# Patient Record
Sex: Male | Born: 1970 | Hispanic: No | Marital: Married | Smoking: Former smoker
Health system: Southern US, Community
[De-identification: ages and names within clinical notes are randomized; demographics above are authoritative.]

## PROBLEM LIST (undated history)

## (undated) DIAGNOSIS — I10 Essential (primary) hypertension: Secondary | ICD-10-CM

## (undated) HISTORY — DX: Essential (primary) hypertension: I10

---

## 2011-02-08 ENCOUNTER — Ambulatory Visit: Payer: Self-pay

## 2011-02-08 DIAGNOSIS — R05 Cough: Secondary | ICD-10-CM

## 2012-09-10 ENCOUNTER — Emergency Department (HOSPITAL_COMMUNITY): Payer: Self-pay

## 2012-09-10 ENCOUNTER — Encounter (HOSPITAL_COMMUNITY): Payer: Self-pay | Admitting: *Deleted

## 2012-09-10 ENCOUNTER — Emergency Department (HOSPITAL_COMMUNITY)
Admission: EM | Admit: 2012-09-10 | Discharge: 2012-09-10 | Disposition: A | Discharge status: Home / Self Care | Payer: Self-pay | Attending: Emergency Medicine | Admitting: Emergency Medicine

## 2012-09-10 DIAGNOSIS — R1013 Epigastric pain: Secondary | ICD-10-CM | POA: Insufficient documentation

## 2012-09-10 DIAGNOSIS — I10 Essential (primary) hypertension: Secondary | ICD-10-CM | POA: Insufficient documentation

## 2012-09-10 DIAGNOSIS — Z87891 Personal history of nicotine dependence: Secondary | ICD-10-CM | POA: Insufficient documentation

## 2012-09-10 DIAGNOSIS — R0789 Other chest pain: Secondary | ICD-10-CM | POA: Insufficient documentation

## 2012-09-10 LAB — COMPREHENSIVE METABOLIC PANEL
Alkaline Phosphatase: 58 U/L (ref 39–117)
BUN: 16 mg/dL (ref 6–23)
GFR calc Af Amer: >90 mL/min (ref >90)
GFR calc non Af Amer: >90 mL/min (ref >90)
Glucose, Bld: 105 mg/dL — ABNORMAL HIGH (ref 70–99)
Potassium: 3.6 mEq/L (ref 3.5–5.1)
Total Bilirubin: 0.4 mg/dL (ref 0.3–1.2)
Total Protein: 6.8 g/dL (ref 6.0–8.3)

## 2012-09-10 LAB — CBC WITH DIFFERENTIAL/PLATELET
Eosinophils Absolute: 0.2 10*3/uL (ref 0.0–0.7)
Hemoglobin: 15.8 g/dL (ref 13.0–17.0)
Lymphs Abs: 1.8 10*3/uL (ref 0.7–4.0)
MCH: 31.6 pg (ref 26.0–34.0)
MCV: 86.8 fL (ref 78.0–100.0)
Monocytes Relative: 6 % (ref 3–12)
Neutrophils Relative %: 69 % (ref 43–77)
RBC: 5 MIL/uL (ref 4.22–5.81)

## 2012-09-10 LAB — LIPASE, BLOOD: Lipase: 49 U/L (ref 11–59)

## 2012-09-10 LAB — POCT I-STAT TROPONIN I: Troponin i, poc: 0.01 ng/mL (ref 0.00–0.08)

## 2012-09-10 MED ORDER — SODIUM CHLORIDE 0.9 % IV SOLN
INTRAVENOUS | Status: DC
  Administered 2012-09-10: 14:00:00 via INTRAVENOUS

## 2012-09-10 NOTE — ED Notes (Addendum)
Pt reports chest pressure radiating into his (L) arm intermittently x 6 months.  Reports dizziness, denies SOB, N/V.  Pt ambulatory.

## 2012-09-10 NOTE — ED Provider Notes (Addendum)
CSN: [id number]     Arrival date & time 09/10/12  1306 History     First MD Initiated Contact with Patient 09/10/12 1323     Chief Complaint  Patient presents with  . Chest Pain   (Consider location/radiation/quality/duration/timing/severity/associated sxs/prior Treatment) Patient is a 42 y.o. male presenting with chest pain. The history is provided by the patient and the spouse.  Chest Pain  patient here after having a 5 minute episode of epigastric discomfort described as a cramp without associated diaphoresis, dyspnea, nausea. Symptoms occurred at rest and were not associated with cough or fever. No radiation to his back. No treatment used for this prior to arrival and symptoms resolved spontaneously. Went to a drug store and his blood pressure checked and it was elevated with a systolic of 140. Patient is currently asymptomatic the  History reviewed. No pertinent past medical history. History reviewed. No pertinent past surgical history. History reviewed. No pertinent family history. History  Substance Use Topics  . Smoking status: Former Games developer  . Smokeless tobacco: Not on file  . Alcohol Use: Yes     Comment: socially-1-2 times a week    Review of Systems  Cardiovascular: Positive for chest pain.  All other systems reviewed and are negative.    Allergies  Review of patient's allergies indicates no known allergies.  Home Medications  No current outpatient prescriptions on file. BP 150/99  Pulse 69  Temp(Src) 97.7 F (36.5 C) (Oral)  Resp 18  SpO2 97% Physical Exam  Nursing note and vitals reviewed. Constitutional: He is oriented to person, place, and time. He appears well-developed and well-nourished.  Non-toxic appearance. No distress.  HENT:  Head: Normocephalic and atraumatic.  Eyes: Conjunctivae, EOM and lids are normal. Pupils are equal, round, and reactive to light.  Neck: Normal range of motion. Neck supple. No tracheal deviation present. No mass present.   Cardiovascular: Normal rate, regular rhythm and normal heart sounds.  Exam reveals no gallop.   No murmur heard. Pulmonary/Chest: Effort normal and breath sounds normal. No stridor. No respiratory distress. He has no decreased breath sounds. He has no wheezes. He has no rhonchi. He has no rales.  Abdominal: Soft. Normal appearance and bowel sounds are normal. He exhibits no distension. There is no tenderness. There is no rebound and no CVA tenderness.  Musculoskeletal: Normal range of motion. He exhibits no edema and no tenderness.  Neurological: He is alert and oriented to person, place, and time. He has normal strength. No cranial nerve deficit or sensory deficit. GCS eye subscore is 4. GCS verbal subscore is 5. GCS motor subscore is 6.  Skin: Skin is warm and dry. No abrasion and no rash noted.  Psychiatric: He has a normal mood and affect. His speech is normal and behavior is normal.    ED Course   Procedures (including critical care time)  Labs Reviewed - No data to display No results found. No diagnosis found.  MDM     Rate: 77   Rhythm: normal sinus rhythm  QRS Axis: normal  Intervals: normal  ST/T Wave abnormalities: normal  Conduction Disutrbances:none  Narrative Interpretation:   Old EKG Reviewed: none available  6:10 PM Patient negative delta troponin here. Symptoms are very atypical doubt that patient has ACS. Will get cardiac referral  Blood pressure was mildly elevated. Patient notes increased stress. Instructed patient to followup with his primary care Dr. for a repeat blood pressure  Toy Baker, MD 09/10/12 1811  Claude Manges  Freida Busman, MD 09/10/12 [phone]

## 2014-06-18 IMAGING — CR DG CHEST 2V
2 series · 2 of 2 positions shown · non-contrast
Comparison: None.

CLINICAL DATA: Chest pain

CHEST - 2 VIEW

[w chest pa]
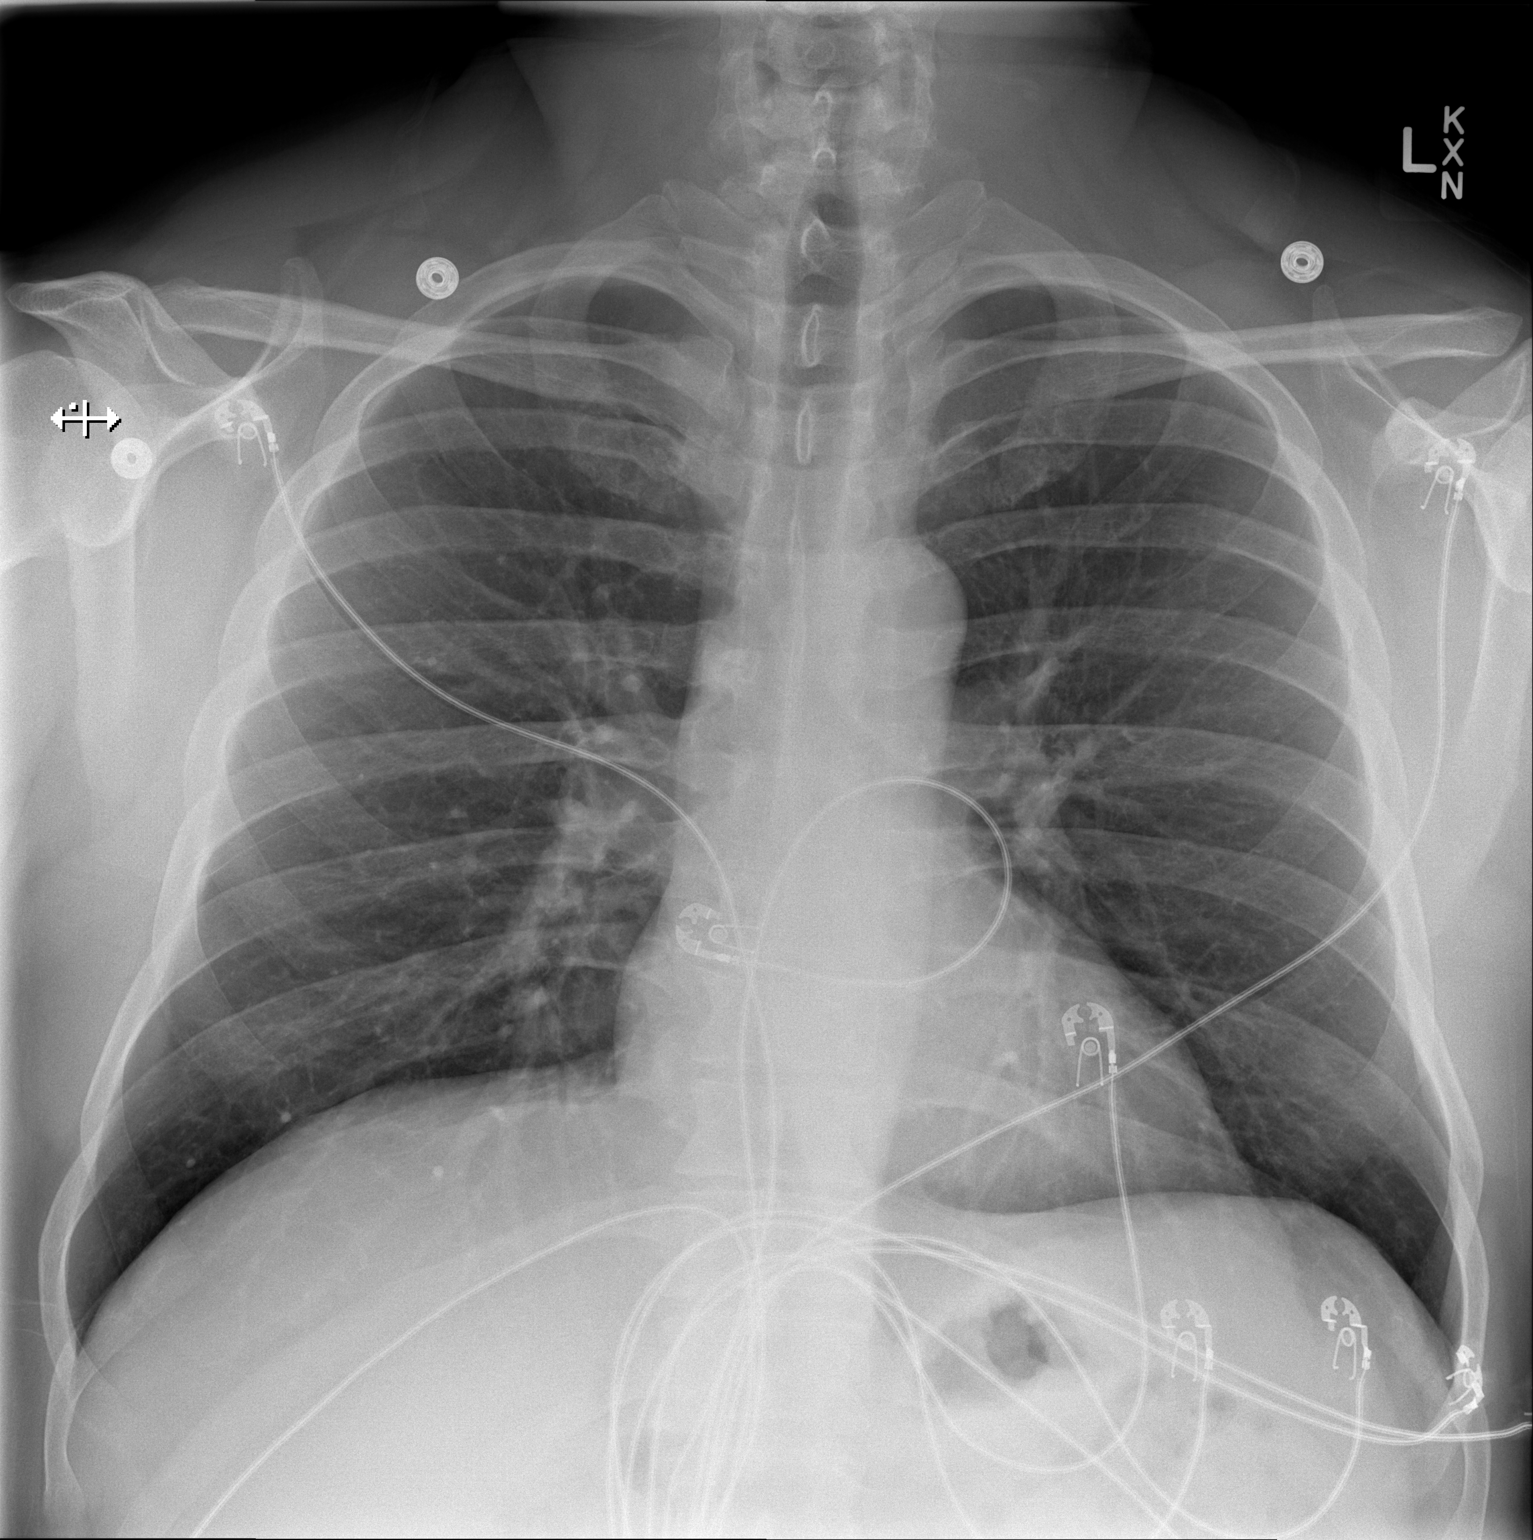

[w chest lat]
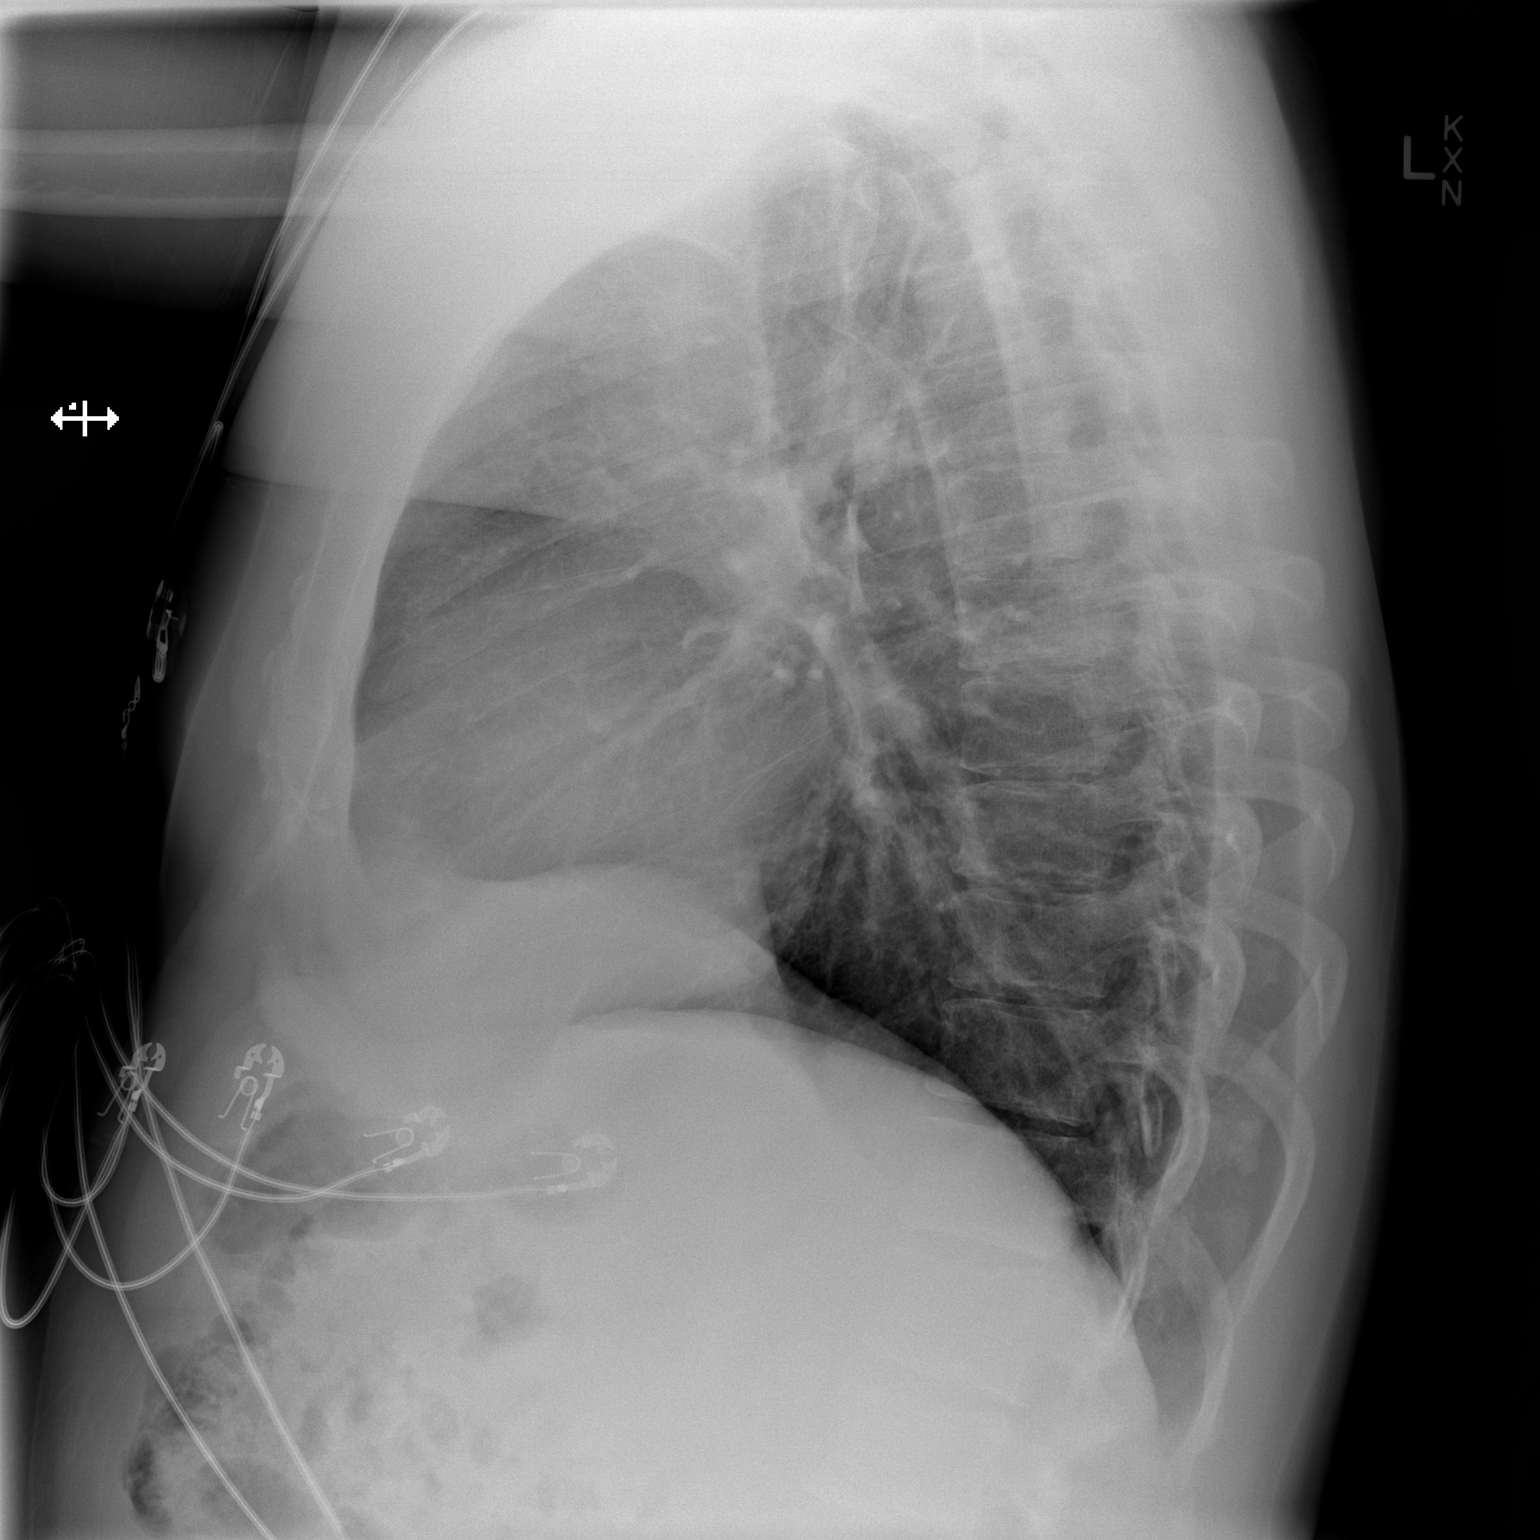

[2 of 2 positions shown; findings below may reference images not displayed]

FINDINGS: Cardiomediastinal silhouette is unremarkable.  No acute
infiltrate or pleural effusion.  No pulmonary edema.  Bony thorax
is unremarkable.
IMPRESSION: No active disease.

## 2015-03-26 ENCOUNTER — Ambulatory Visit (INDEPENDENT_AMBULATORY_CARE_PROVIDER_SITE_OTHER): Payer: Self-pay | Admitting: Physician Assistant

## 2015-03-26 VITALS — BP 132/78 | HR 106 | Temp 102.9°F | Resp 20 | Ht 71.0 in | Wt 238.0 lb

## 2015-03-26 DIAGNOSIS — R69 Illness, unspecified: Secondary | ICD-10-CM

## 2015-03-26 DIAGNOSIS — R05 Cough: Secondary | ICD-10-CM

## 2015-03-26 DIAGNOSIS — R5383 Other fatigue: Secondary | ICD-10-CM

## 2015-03-26 DIAGNOSIS — R509 Fever, unspecified: Secondary | ICD-10-CM

## 2015-03-26 DIAGNOSIS — J111 Influenza due to unidentified influenza virus with other respiratory manifestations: Secondary | ICD-10-CM

## 2015-03-26 LAB — POCT INFLUENZA A/B
Influenza A, POC: NEGATIVE
Influenza B, POC: NEGATIVE

## 2015-03-26 MED ORDER — OSELTAMIVIR PHOSPHATE 75 MG PO CAPS: 75.0000 mg | ORAL_CAPSULE | Freq: Two times a day (BID) | ORAL | Status: DC

## 2015-03-26 MED ORDER — ACETAMINOPHEN 325 MG PO TABS
1000.0000 mg | ORAL_TABLET | Freq: Once | ORAL | Status: AC
  Administered 2015-03-26: 975 mg via ORAL

## 2015-03-26 NOTE — Progress Notes (Signed)
   03/26/2015 1:27 PM   DOB: [birth year] / MRN: [id number]  SUBJECTIVE:  Zachary Church is a 45 y.o. male presenting for fever, chills, and backache that started 48 hours ago.  He reports "I feel terrible." He has not taken anything for his symptoms.  He has not had the seasonal flu shot.  His son, who is also being seen in clinic today has been diagnosed with influenza B.    He has No Known Allergies.   He  has a past medical history of Hypertension.    He  reports that he has quit smoking. He does not have any smokeless tobacco history on file. He reports that he drinks alcohol. He reports that he does not use illicit drugs. He  has no sexual activity history on file. The patient  has no past surgical history on file.  His family history is not on file.  Review of Systems  Constitutional: Positive for fever, chills and malaise/fatigue.  Eyes: Negative for blurred vision.  Respiratory: Positive for cough. Negative for shortness of breath.   Cardiovascular: Negative for chest pain.  Gastrointestinal: Negative for nausea and abdominal pain.  Genitourinary: Negative for dysuria, urgency and frequency.  Musculoskeletal: Positive for myalgias.  Skin: Negative for rash.  Neurological: Negative for dizziness, tingling and headaches.  Psychiatric/Behavioral: Negative for depression. The patient is not nervous/anxious.     Problem list and medications reviewed and updated by myself where necessary, and exist elsewhere in the encounter.   OBJECTIVE:  BP 132/78 mmHg  Pulse 106  Temp(Src) 102.9 F (39.4 C) (Oral)  Resp 20  Ht 5\' 11"  (1.803 m)  Wt 238 lb (107.956 kg)  BMI 33.21 kg/m2  SpO2 98%  Physical Exam  Constitutional: He is oriented to person, place, and time. He appears well-developed. He does not appear ill.  Eyes: Conjunctivae and EOM are normal. Pupils are equal, round, and reactive to light.  Cardiovascular: Normal rate.   Pulmonary/Chest: Effort normal.  Abdominal: He exhibits  no distension.  Musculoskeletal: Normal range of motion.  Neurological: He is alert and oriented to person, place, and time. No cranial nerve deficit. Coordination normal.  Skin: Skin is warm and dry. He is not diaphoretic.  Psychiatric: He has a normal mood and affect.  Nursing note and vitals reviewed.   Results for orders placed or performed in visit on 03/26/15 (from the past 72 hour(s))  POCT Influenza A/B     Status: None   Collection Time: 03/26/15  1:11 PM  Result Value Ref Range   Influenza A, POC Negative Negative   Influenza B, POC Negative Negative    No results found.  ASSESSMENT AND PLAN  Mehar was seen today for influenza, cough and back pain.  Diagnoses and all orders for this visit:  Fever, unspecified -     POCT Influenza A/B -     acetaminophen (TYLENOL) tablet 975 mg; Take 3 tablets (975 mg total) by mouth once.  Influenza-like illness -     oseltamivir (TAMIFLU) 75 MG capsule; Take 1 capsule (75 mg total) by mouth 2 (two) times daily.    The patient was advised to call or return to clinic if he does not see an improvement in symptoms or to seek the care of the closest emergency department if he worsens with the above plan.   Deliah BostonMichael Clark, MHS, PA-C [facility] 03/26/2015 1:27 PM

## 2015-11-21 ENCOUNTER — Ambulatory Visit (INDEPENDENT_AMBULATORY_CARE_PROVIDER_SITE_OTHER): Payer: Self-pay | Admitting: Podiatry

## 2015-11-21 ENCOUNTER — Encounter: Payer: Self-pay | Admitting: Podiatry

## 2015-11-21 VITALS — BP 139/97 | HR 81 | Resp 14

## 2015-11-21 DIAGNOSIS — L600 Ingrowing nail: Secondary | ICD-10-CM

## 2015-11-21 DIAGNOSIS — L03032 Cellulitis of left toe: Secondary | ICD-10-CM

## 2015-11-21 NOTE — Progress Notes (Signed)
   Subjective:    Patient ID: Zachary Church, male    DOB: 09-05-1970, 45 y.o.   MRN: [id number]  HPI this patient presents the office with chief complaint of a painful ingrown toenail big toe left foot.  He states the toe nail has area has become red, swollen and painful. He says that the toenail has been extremely painful the last 2 days. He states he has difficulty walking and wearing his shoes. He denies any drainage noted from the site. Since the office today for an evaluation and treatment of this condition    Review of Systems  All other systems reviewed and are negative.      Objective:   Physical Exam GENERAL APPEARANCE: Alert, conversant. Appropriately groomed. No acute distress.  VASCULAR: Pedal pulses are  palpable at  [facility] bilateral.  Capillary refill time is immediate to all digits,  Normal temperature gradient.  Digital hair growth is present bilateral  NEUROLOGIC: sensation is normal to 5.07 monofilament at 5/5 sites bilateral.  Light touch is intact bilateral, Muscle strength normal.  MUSCULOSKELETAL: acceptable muscle strength, tone and stability bilateral.  Intrinsic muscluature intact bilateral.  Rectus appearance of foot and digits noted bilateral.   DERMATOLOGIC: skin color, texture, and turgor are within normal limits.  No preulcerative lesions or ulcers  are seen, no interdigital maceration noted.  No open lesions present. No drainage noted.  NAILS  Marked incurvation medial border left great toenail.  No granulation tissue or pus noted.         Assessment & Plan:  Ingrown Toenail  Paronychia medial border left great toenail.  IE  Nail surgery.  Treatment options and alternatives discussed.  Recommended an incision and drainage and patient agreed.  Left hallux  was prepped with alcohol and a 3cc. of  2% lidocaine plain was administered in a digital block fashion.  The toe was then prepped with betadine solution .  The offending nail border was then excised and  all necrotic tissue was resected.  The area was then cleansed  and antibiotic ointment and a dry sterile dressing was applied.  The patient was dispensed instructions for aftercare.    Helane GuntherGregory Mayer DPM

## 2015-11-29 ENCOUNTER — Ambulatory Visit: Payer: Self-pay | Admitting: Podiatry

## 2017-07-29 ENCOUNTER — Other Ambulatory Visit: Payer: Self-pay

## 2017-07-29 ENCOUNTER — Ambulatory Visit: Payer: Self-pay | Admitting: Physician Assistant

## 2017-07-29 ENCOUNTER — Encounter: Payer: Self-pay | Admitting: Physician Assistant

## 2017-07-29 VITALS — BP 124/88 | HR 82 | Temp 98.9°F | Resp 16 | Ht 71.0 in | Wt 223.4 lb

## 2017-07-29 DIAGNOSIS — R05 Cough: Secondary | ICD-10-CM

## 2017-07-29 DIAGNOSIS — R059 Cough, unspecified: Secondary | ICD-10-CM

## 2017-07-29 DIAGNOSIS — J069 Acute upper respiratory infection, unspecified: Secondary | ICD-10-CM

## 2017-07-29 MED ORDER — HYDROCODONE-HOMATROPINE 5-1.5 MG/5ML PO SYRP: 2.5000 mL | ORAL_SOLUTION | Freq: Every day | ORAL | 0 refills | Status: AC

## 2017-07-29 MED ORDER — AZITHROMYCIN 250 MG PO TABS: ORAL_TABLET | ORAL | 0 refills | Status: DC

## 2017-07-29 NOTE — Patient Instructions (Addendum)
Most likely viral however if symptoms persist greater than 10 days or fever greater than 100.4 fill Z-Pak.    IF you received an x-ray today, you will receive an invoice from [facility]. Please contact [facility] at [phone] with questions or concerns regarding your invoice.   IF you received labwork today, you will receive an invoice from [facility]. Please contact [facility] at [phone] with questions or concerns regarding your invoice.   Our billing staff will not be able to assist you with questions regarding bills from these companies.  You will be contacted with the lab results as soon as they are available. The fastest way to get your results is to activate your My Chart account. Instructions are located on the last page of this paperwork. If you have not heard from us regarding the results in 2 weeks, please contact this office.

## 2017-07-29 NOTE — Progress Notes (Signed)
07/29/2017 9:33 AM   DOB: [birth year] / MRN: [id number]  SUBJECTIVE:  Zachary Church is a 47 y.o. male presenting for a sick visit and complains of cough, HA, runny nose. Symptoms present for 5 days.  The problem is no better or worse. He has tried several OTC preps with mild relief.  He has No Known Allergies.   He  has a past medical history of Hypertension.    He  reports that he has quit smoking. He has never used smokeless tobacco. He reports that he drinks alcohol. He reports that he does not use drugs. He  has no sexual activity history on file. The patient  has no past surgical history on file.  His family history is not on file.  Review of Systems  Constitutional: Negative for chills, diaphoresis and fever.  HENT: Positive for congestion and sore throat.   Eyes: Negative.   Respiratory: Positive for cough. Negative for hemoptysis, sputum production, shortness of breath and wheezing.   Cardiovascular: Negative for chest pain, orthopnea and leg swelling.  Gastrointestinal: Negative for nausea.  Skin: Negative for rash.  Neurological: Negative for dizziness, sensory change, speech change, focal weakness and headaches.    The problem list and medications were reviewed and updated by myself where necessary and exist elsewhere in the encounter.   OBJECTIVE:  BP 124/88   Pulse 82   Temp 98.9 F (37.2 C)   Resp 16   Ht 5\' 11"  (1.803 m)   Wt 223 lb 6.4 oz (101.3 kg)   SpO2 95%   BMI 31.16 kg/m   Wt Readings from Last 3 Encounters:  07/29/17 223 lb 6.4 oz (101.3 kg)  03/26/15 238 lb (108 kg)   Temp Readings from Last 3 Encounters:  07/29/17 98.9 F (37.2 C)  03/26/15 (!) 102.9 F (39.4 C) (Oral)  09/10/12 97.7 F (36.5 C) (Oral)   BP Readings from Last 3 Encounters:  07/29/17 124/88  11/21/15 (!) 139/97  03/26/15 132/78   Pulse Readings from Last 3 Encounters:  07/29/17 82  11/21/15 81  03/26/15 (!) 106    Physical Exam  Constitutional: He is oriented to  person, place, and time. He appears well-developed. He is active.  Non-toxic appearance. He does not appear ill.  HENT:  Mouth/Throat: Oropharynx is clear and moist.  Nasal turbinates hyperemic.  Eyes: Pupils are equal, round, and reactive to light. Conjunctivae and EOM are normal.  Cardiovascular: Normal rate, regular rhythm, S1 normal, S2 normal, normal heart sounds, intact distal pulses and normal pulses. Exam reveals no gallop and no friction rub.  No murmur heard. Pulmonary/Chest: Effort normal and breath sounds normal. He has no rales.  Abdominal: He exhibits no distension.  Musculoskeletal: Normal range of motion. He exhibits no edema.  Neurological: He is alert and oriented to person, place, and time. No cranial nerve deficit. Coordination normal.  Skin: Skin is warm and dry. He is not diaphoretic. No pallor.  Psychiatric: He has a normal mood and affect.  Nursing note and vitals reviewed.    Lab Results  Component Value Date   WBC 7.9 09/10/2012   HGB 15.8 09/10/2012   HCT 43.4 09/10/2012   MCV 86.8 09/10/2012   PLT 171 09/10/2012    Lab Results  Component Value Date   CREATININE 1.01 09/10/2012   BUN 16 09/10/2012   NA 139 09/10/2012   K 3.6 09/10/2012   CL 103 09/10/2012   CO2 25 09/10/2012    Lab Results  Component Value Date   ALT 46 09/10/2012   AST 29 09/10/2012   ALKPHOS 58 09/10/2012   BILITOT 0.4 09/10/2012     ASSESSMENT AND PLAN:  Virgil was seen today for uri.  Diagnoses and all orders for this visit:  Cough: New problem requiring prescription medication. Comments: Worse at night and waking him up.  Providing Hycodan. Orders: -     azithromycin (ZITHROMAX) 250 MG tablet; Take 2 on day 1 and 1 daily thereafter.  Acute URI Comments: Most likely viral however if symptoms persist greater than 10 days or fever greater than 100.4 fill Z-Pak. Orders: -     HYDROcodone-homatropine (HYCODAN) 5-1.5 MG/5ML syrup; Take 2.5-5 mLs by mouth at bedtime  for 5 days.    The patient is advised to call or return to clinic if he does not see an improvement in symptoms, or to seek the care of the closest emergency department if he worsens with the above plan.   Deliah Boston, MHS, PA-C [facility] 07/29/2017 9:33 AM

## 2018-06-22 ENCOUNTER — Ambulatory Visit
Admission: EM | Admit: 2018-06-22 | Discharge: 2018-06-22 | Disposition: A | Discharge status: Home / Self Care | Payer: Self-pay

## 2018-06-22 ENCOUNTER — Other Ambulatory Visit: Payer: Self-pay

## 2018-06-22 DIAGNOSIS — I10 Essential (primary) hypertension: Secondary | ICD-10-CM

## 2018-06-22 DIAGNOSIS — M545 Low back pain, unspecified: Secondary | ICD-10-CM

## 2018-06-22 MED ORDER — MELOXICAM 7.5 MG PO TABS: 7.5000 mg | ORAL_TABLET | Freq: Every day | ORAL | 0 refills | Status: AC

## 2018-06-22 MED ORDER — METHOCARBAMOL 500 MG PO TABS: 500.0000 mg | ORAL_TABLET | Freq: Two times a day (BID) | ORAL | 0 refills | Status: AC

## 2018-06-22 NOTE — ED Provider Notes (Signed)
[facility]    CSN: [id number] Arrival date & time: 06/22/18  1322     History   Chief Complaint Chief Complaint  Patient presents with  . Back Pain    HPI Zachary Church is a 48 y.o. male.   48 year old male comes in for 1 week history of right lower back pain that occasionally radiates down the right leg. Denies injury/trauma. Pain is cramping in sensation with tightness, worse with movement. 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 He denies saddle anesthesia, loss of bladder or bowel control. Was going to take left over muscle relaxant, but realized it was expired. Has tried topical medications without relief.      Past Medical History:  Diagnosis Date  . Hypertension     There are no active problems to display for this patient.   History reviewed. No pertinent surgical history.     Home Medications    Prior to Admission medications   Medication Sig Start Date End Date Taking? Authorizing Provider  losartan (COZAAR) 25 MG tablet Take 25 mg by mouth daily.   Yes [provider]  meloxicam (MOBIC) 7.5 MG tablet Take 1 tablet (7.5 mg total) by mouth daily. 06/22/18   Cathie Hoops, Shaunie Boehm V, PA-C  methocarbamol (ROBAXIN) 500 MG tablet Take 1 tablet (500 mg total) by mouth 2 (two) times daily. 06/22/18   Belinda Fisher, PA-C    Family History No family history on file.  Social History Social History   Tobacco Use  . Smoking status: Former Games developer  . Smokeless tobacco: Never Used  Substance Use Topics  . Alcohol use: Yes    Comment: socially-1-2 times a week  . Drug use: No     Allergies   Patient has no known allergies.   Review of Systems Review of Systems  Reason unable to perform ROS: See HPI as above.     Physical Exam Triage Vital Signs ED Triage Vitals  Enc Vitals Group     BP 06/22/18 1335 (!) 169/116     Pulse Rate 06/22/18 1335 81     Resp 06/22/18  1335 20     Temp 06/22/18 1335 98.4 F (36.9 C)     Temp Source 06/22/18 1335 Oral     SpO2 06/22/18 1335 96 %     Weight --      Height --      Head Circumference --      Peak Flow --      Pain Score 06/22/18 1336 9     Pain Loc --      Pain Edu? --      Excl. in GC? --    No data found.  Updated Vital Signs BP (!) 151/112 (BP Location: Left Arm)   Pulse 81   Temp 98.4 F (36.9 C) (Oral)   Resp 20   SpO2 96%   Visual Acuity Right Eye Distance:   Left Eye Distance:   Bilateral Distance:    Right Eye Near:   Left Eye Near:    Bilateral Near:     Physical Exam Constitutional:      General: He is not in acute distress.    Appearance: He is well-developed. He is not diaphoretic.  HENT:     Head: Normocephalic and atraumatic.  Eyes:     Conjunctiva/sclera: Conjunctivae normal.     Pupils: Pupils are equal, round, and reactive  to light.  Cardiovascular:     Rate and Rhythm: Normal rate and regular rhythm.     Heart sounds: Normal heart sounds. No murmur. No friction rub. No gallop.   Pulmonary:     Effort: Pulmonary effort is normal. No accessory muscle usage or respiratory distress.     Breath sounds: Normal breath sounds. No stridor. No decreased breath sounds, wheezing, rhonchi or rales.  Musculoskeletal:     Comments: No tenderness on palpation of the spinous processes. Tenderness to palpation of right lumbar paraspinal muscle. Full range of motion of back and hips. Strength normal and equal bilaterally. Sensation intact and equal bilaterally. Negative straight leg raise.   Skin:    General: Skin is warm and dry.  Neurological:     Mental Status: He is alert and oriented to person, place, and time.      UC Treatments / Results  Labs (all labs ordered are listed, but only abnormal results are displayed) Labs Reviewed - No data to display  EKG None  Radiology No results found.  Procedures Procedures (including critical care time)  Medications  Ordered in UC Medications - No data to display  Initial Impression / Assessment and Plan / UC Course  I have reviewed the triage vital signs and the nursing notes.  Pertinent labs & imaging results that were available during my care of the patient were reviewed by me and considered in my medical decision making (see chart for details).    Start NSAID as directed for pain and inflammation. Muscle relaxant as needed. Ice/heat compresses. Discussed with patient strain can take up to 3-4 weeks to resolve, but should be getting better each week. Return precautions given.   Final Clinical Impressions(s) / UC Diagnoses   Final diagnoses:  Acute right-sided low back pain without sciatica    ED Prescriptions    Medication Sig Dispense Auth. Provider   meloxicam (MOBIC) 7.5 MG tablet Take 1 tablet (7.5 mg total) by mouth daily. 15 tablet Allanna Bresee V, PA-C   methocarbamol (ROBAXIN) 500 MG tablet Take 1 tablet (500 mg total) by mouth 2 (two) times daily. 20 tablet Threasa AlphaYu, Marrian Bells V, PA-C        Morgann Woodburn V, [place]PA-C 06/22/18 1443

## 2018-06-22 NOTE — Discharge Instructions (Signed)
Start Mobic. Do not take ibuprofen (motrin/advil)/ naproxen (aleve) while on mobic. Robaxin as needed, this can make you drowsy, so do not take if you are going to drive, operate heavy machinery, or make important decisions. Ice/heat compresses as needed. This can take up to 3-4 weeks to completely resolve, but you should be feeling better each week. Follow up here or with PCP if symptoms worsen, changes for reevaluation. If experience numbness/tingling of the inner thighs, loss of bladder or bowel control, go to the emergency department for evaluation.  ° °

## 2018-06-22 NOTE — ED Triage Notes (Signed)
Pt c/o lower back pain radiating down rt leg for the past week. Denies injury

## 2018-09-15 ENCOUNTER — Emergency Department (HOSPITAL_COMMUNITY)
Admission: EM | Admit: 2018-09-15 | Discharge: 2018-09-15 | Disposition: A | Discharge status: Home / Self Care | Payer: Self-pay | Attending: Emergency Medicine | Admitting: Emergency Medicine

## 2018-09-15 DIAGNOSIS — Z79899 Other long term (current) drug therapy: Secondary | ICD-10-CM | POA: Insufficient documentation

## 2018-09-15 DIAGNOSIS — Z87891 Personal history of nicotine dependence: Secondary | ICD-10-CM | POA: Insufficient documentation

## 2018-09-15 DIAGNOSIS — I10 Essential (primary) hypertension: Secondary | ICD-10-CM | POA: Insufficient documentation

## 2018-09-15 DIAGNOSIS — R42 Dizziness and giddiness: Secondary | ICD-10-CM | POA: Insufficient documentation

## 2018-09-15 LAB — CBC
HCT: 46.4 % (ref 39.0–52.0)
Hemoglobin: 15.9 g/dL (ref 13.0–17.0)
MCH: 31.2 pg (ref 26.0–34.0)
MCHC: 34.3 g/dL (ref 30.0–36.0)
MCV: 91.2 fL (ref 80.0–100.0)
Platelets: 177 10*3/uL (ref 150–400)
RBC: 5.09 MIL/uL (ref 4.22–5.81)
RDW: 12.1 % (ref 11.5–15.5)
WBC: 7.9 10*3/uL (ref 4.0–10.5)
nRBC: 0 % (ref 0.0–0.2)

## 2018-09-15 LAB — BASIC METABOLIC PANEL
Anion gap: 10 (ref 5–15)
BUN: 15 mg/dL (ref 6–20)
CO2: 25 mmol/L (ref 22–32)
Calcium: 9.2 mg/dL (ref 8.9–10.3)
Chloride: 104 mmol/L (ref 98–111)
Creatinine, Ser: 0.98 mg/dL (ref 0.61–1.24)
GFR calc Af Amer: >60 mL/min (ref >60)
GFR calc non Af Amer: >60 mL/min (ref >60)
Glucose, Bld: 136 mg/dL — ABNORMAL HIGH (ref 70–99)
Potassium: 4.2 mmol/L (ref 3.5–5.1)
Sodium: 139 mmol/L (ref 135–145)

## 2018-09-15 NOTE — ED Notes (Signed)
Pt denies vision changes with the posterior head pain.

## 2018-09-15 NOTE — ED Triage Notes (Signed)
Pt states at 0830 he began to have a headache in the back of his head and became dizzy pt took another 50mg  of losartan & 2 81mg  asa due to thinking this was his BP. Pt states around 1030 the dizziness and pain went away but came to ED to be seen due to episode this am.

## 2018-09-15 NOTE — ED Provider Notes (Signed)
[facility] Note   CSN: [id number] Arrival date & time: 09/15/18  1035     History   Chief Complaint Chief Complaint  Patient presents with  . Headache  . Dizziness    HPI Zachary Church is a 48 y.o. male.  He has a history of hypertension.  He said he has been very stressed with work and today something set him off at work and he felt a lot of tightness in the back of his neck and shoulders and anxiety.  He thought his blood pressure may be elevated so he took an extra dose of blood pressure medicine and left work.  He said he felt a little dizzy afterwards and was anxious about this episode and so presented here to be evaluated.  He said he is feeling better although when he starts thinking about an episode at work again the feeling will come back on him.  He denies any blurry vision double vision chest pain shortness of breath syncope.  There is no numbness or focal weakness.     The history is provided by the patient.  Headache Pain location:  Occipital Quality:  Dull Radiates to:  R neck, L neck, L shoulder and R shoulder Severity currently:  0/10 Severity at highest:  Unable to specify Onset quality:  Gradual Duration:  1 hour Progression:  Resolved Chronicity:  New Context: emotional stress   Relieved by: rest. Associated symptoms: dizziness   Associated symptoms: no abdominal pain, no back pain, no blurred vision, no cough, no diarrhea, no eye pain, no fever, no focal weakness, no nausea, no numbness, no sore throat, no syncope, no visual change, no vomiting and no weakness   Dizziness Associated symptoms: headaches   Associated symptoms: no chest pain, no diarrhea, no nausea, no shortness of breath, no syncope, no vision changes, no vomiting and no weakness     Past Medical History:  Diagnosis Date  . Hypertension     There are no active problems to display for this patient.   No past surgical history on file.      Home Medications    Prior to Admission medications   Medication Sig Start Date End Date Taking? Authorizing Provider  losartan (COZAAR) 25 MG tablet Take 25 mg by mouth daily.    [provider]  meloxicam (MOBIC) 7.5 MG tablet Take 1 tablet (7.5 mg total) by mouth daily. 06/22/18   Cathie HoopsYu, Amy V, PA-C  methocarbamol (ROBAXIN) 500 MG tablet Take 1 tablet (500 mg total) by mouth 2 (two) times daily. 06/22/18   Belinda FisherYu, Amy V, PA-C    Family History No family history on file.  Social History Social History   Tobacco Use  . Smoking status: Former Games developermoker  . Smokeless tobacco: Never Used  Substance Use Topics  . Alcohol use: Yes    Comment: socially-1-2 times a week  . Drug use: No     Allergies   Patient has no known allergies.   Review of Systems Review of Systems  Constitutional: Negative for fever.  HENT: Negative for sore throat.   Eyes: Negative for blurred vision, pain and visual disturbance.  Respiratory: Negative for cough and shortness of breath.   Cardiovascular: Negative for chest pain and syncope.  Gastrointestinal: Negative for abdominal pain, diarrhea, nausea and vomiting.  Genitourinary: Negative for dysuria.  Musculoskeletal: Negative for back pain.  Skin: Negative for rash.  Neurological: Positive for dizziness and headaches. Negative for focal weakness,  weakness and numbness.     Physical Exam Updated Vital Signs BP (!) 166/106 (BP Location: Left Arm)   Pulse 80   Temp 98.8 F (37.1 C) (Oral)   Resp 17   SpO2 97%   Physical Exam Vitals signs and nursing note reviewed.  Constitutional:      Appearance: He is well-developed.  HENT:     Head: Normocephalic and atraumatic.  Eyes:     Extraocular Movements: Extraocular movements intact.     Conjunctiva/sclera: Conjunctivae normal.     Pupils: Pupils are equal, round, and reactive to light.  Neck:     Musculoskeletal: Neck supple.  Cardiovascular:     Rate and Rhythm: Normal rate and regular  rhythm.     Heart sounds: No murmur.  Pulmonary:     Effort: Pulmonary effort is normal. No respiratory distress.     Breath sounds: Normal breath sounds.  Abdominal:     Palpations: Abdomen is soft.     Tenderness: There is no abdominal tenderness.  Musculoskeletal: Normal range of motion.     Right lower leg: No edema.     Left lower leg: No edema.  Skin:    General: Skin is warm and dry.     Capillary Refill: Capillary refill takes less than 2 seconds.  Neurological:     Mental Status: He is alert and oriented to person, place, and time.     GCS: GCS eye subscore is 4. GCS verbal subscore is 5. GCS motor subscore is 6.     Cranial Nerves: No cranial nerve deficit.     Sensory: No sensory deficit.     Motor: No weakness.     Gait: Gait normal.      ED Treatments / Results  Labs (all labs ordered are listed, but only abnormal results are displayed) Labs Reviewed  BASIC METABOLIC PANEL - Abnormal; Notable for the following components:      Result Value   Glucose, Bld 136 (*)    All other components within normal limits  CBC    EKG EKG Interpretation  Date/Time:  Wednesday September 15 2018 12:15:28 EDT Ventricular Rate:  69 PR Interval:    QRS Duration: 95 QT Interval:  391 QTC Calculation: 419 R Axis:   73 Text Interpretation:  Sinus rhythm ST elev, probable normal early repol pattern similar to prior 8/14 Confirmed by Aletta Edouard [id number]) on 09/15/2018 12:18:20 PM Also confirmed by Aletta Edouard [phone]), editor Oswaldo Milian, Beverly ([id number])  on 09/16/2018 8:40:03 AM   Radiology No results found.  Procedures Procedures (including critical care time)  Medications Ordered in ED Medications - No data to display   Initial Impression / Assessment and Plan / ED Course  I have reviewed the triage vital signs and the nursing notes.  Pertinent labs & imaging results that were available during my care of the patient were reviewed by me and considered in my medical  decision making (see chart for details).  Clinical Course as of Sep 16 1323  Wed Sep 14, 2537  3179 48 year old male with history of hypertension here after feeling very stressed at work and complaining of tightness in the back of his head and through his neck and feeling dizzy lightheaded.  Symptoms are improved here.  Blood pressure still slightly elevated here.  Labs and EKG are unremarkable other than a slightly elevated glucose of 136.  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  He is  feeling back to baseline.   [MB]  1251 I reviewed all the results with the patient and recommended that he monitor his blood pressure with a home monitor and record the results so he can bring them into his primary care doctor for evaluation if he needs more blood pressure medicine.  His family member also states he would like him to see somebody for his snoring and concerned that he might have some sleep apnea.  I asked that he mention this to his primary care doctor because they would have to do the referral.   [MB]    Clinical Course User Index [MB] Terrilee Files, MD        Final Clinical Impressions(s) / ED Diagnoses   Final diagnoses:  Hypertension, unspecified type  Dizziness    ED Discharge Orders    None       Terrilee Files, MD 09/16/18 1326

## 2018-09-15 NOTE — Discharge Instructions (Signed)
You were seen in the emergency department for dizziness and tension in your head and neck.  Your blood pressure was noted to be elevated here and this will need to be followed with your primary care doctor.  Please check your blood pressure at home and record the readings so your blood primary care doctor will have some information to make a decision whether you need a blood pressure medication adjustment.  You should also mention your snoring to your doctors so they can make you a referral to evaluate you for sleep apnea.

## 2022-11-12 ENCOUNTER — Encounter (HOSPITAL_COMMUNITY): Payer: Self-pay

## 2022-11-12 ENCOUNTER — Emergency Department (HOSPITAL_COMMUNITY): Payer: Commercial Managed Care - PPO

## 2022-11-12 ENCOUNTER — Other Ambulatory Visit: Payer: Self-pay

## 2022-11-12 ENCOUNTER — Emergency Department (HOSPITAL_COMMUNITY)
Admission: EM | Admit: 2022-11-12 | Discharge: 2022-11-12 | Disposition: A | Discharge status: Home / Self Care | Payer: Commercial Managed Care - PPO | Attending: Emergency Medicine | Admitting: Emergency Medicine

## 2022-11-12 DIAGNOSIS — Z79899 Other long term (current) drug therapy: Secondary | ICD-10-CM | POA: Insufficient documentation

## 2022-11-12 DIAGNOSIS — R519 Headache, unspecified: Secondary | ICD-10-CM | POA: Insufficient documentation

## 2022-11-12 DIAGNOSIS — I10 Essential (primary) hypertension: Secondary | ICD-10-CM | POA: Diagnosis not present

## 2022-11-12 DIAGNOSIS — Z7982 Long term (current) use of aspirin: Secondary | ICD-10-CM | POA: Insufficient documentation

## 2022-11-12 LAB — CBC WITH DIFFERENTIAL/PLATELET
Abs Immature Granulocytes: 0.04 10*3/uL (ref 0.00–0.07)
Basophils Absolute: 0 10*3/uL (ref 0.0–0.1)
Basophils Relative: 0 %
Eosinophils Absolute: 0.1 10*3/uL (ref 0.0–0.5)
Eosinophils Relative: 2 %
HCT: 50.4 % (ref 39.0–52.0)
Hemoglobin: 17 g/dL (ref 13.0–17.0)
Immature Granulocytes: 1 %
Lymphocytes Relative: 25 %
Lymphs Abs: 2 10*3/uL (ref 0.7–4.0)
MCH: 31.5 pg (ref 26.0–34.0)
MCHC: 33.7 g/dL (ref 30.0–36.0)
MCV: 93.5 fL (ref 80.0–100.0)
Monocytes Absolute: 0.5 10*3/uL (ref 0.1–1.0)
Monocytes Relative: 7 %
Neutro Abs: 5.3 10*3/uL (ref 1.7–7.7)
Neutrophils Relative %: 65 %
Platelets: 177 10*3/uL (ref 150–400)
RBC: 5.39 MIL/uL (ref 4.22–5.81)
RDW: 12.1 % (ref 11.5–15.5)
WBC: 7.9 10*3/uL (ref 4.0–10.5)
nRBC: 0 % (ref 0.0–0.2)

## 2022-11-12 LAB — BASIC METABOLIC PANEL
Anion gap: 7 (ref 5–15)
BUN: 20 mg/dL (ref 6–20)
CO2: 24 mmol/L (ref 22–32)
Calcium: 8.7 mg/dL — ABNORMAL LOW (ref 8.9–10.3)
Chloride: 106 mmol/L (ref 98–111)
Creatinine, Ser: 0.97 mg/dL (ref 0.61–1.24)
GFR, Estimated: >60 mL/min (ref >60)
Glucose, Bld: 107 mg/dL — ABNORMAL HIGH (ref 70–99)
Potassium: 4 mmol/L (ref 3.5–5.1)
Sodium: 137 mmol/L (ref 135–145)

## 2022-11-12 MED ORDER — HYDROCHLOROTHIAZIDE 12.5 MG PO TABS
12.5000 mg | ORAL_TABLET | Freq: Once | ORAL | Status: AC
  Administered 2022-11-12: 12.5 mg via ORAL
  Filled 2022-11-12: qty 1

## 2022-11-12 MED ORDER — HYDROCHLOROTHIAZIDE 12.5 MG PO TABS: 12.5000 mg | ORAL_TABLET | Freq: Every day | ORAL | 1 refills | Status: AC

## 2022-11-12 NOTE — ED Triage Notes (Signed)
Pt c/o severe headache for three days, worst today. Pt believe this is due to his high blood pressure.

## 2022-11-12 NOTE — Discharge Instructions (Addendum)

## 2022-11-12 NOTE — ED Provider Notes (Signed)
[place] EMERGENCY DEPARTMENT AT [facility] Provider Note   CSN: [id number] Arrival date & time: 11/12/22  1439    History  High blood pressure   Zachary Church is a 52 y.o. male with no significant past medical history here for headache and elevated blood pressure. States 3 days ago he developed headache located to the right side of his head. Describes as a pressure sensation. Goes from right forehead into the temporal region and right occiput. Does not extend into neck. No sudden onset thunderclap headache. No recent trauma or injury. Taking ASA at home with mild relief. States he gets headaches when his BP is elevated. Took BP at home at was elevated so came here. No fever, neck pain, neck stiffness, vision changes, facial droop, slurred speech, CP, SOB, numbness, weakness.  States he has been told previously he had hypertension need to follow-up outpatient however he tried changing his diet to see if that would help prior to medication.  He does not currently have PCP  Patient states he is able to go to [employer] yesterday and today full session of any difficulty. No change in HA with working out. Denies falls or injury.  HPI     Home Medications Prior to Admission medications   Medication Sig Start Date End Date Taking? Authorizing Provider  aspirin EC 81 MG tablet Take 162 mg by mouth every 6 (six) hours as needed (for headaches). Swallow whole.   Yes [provider]  Cholecalciferol (VITAMIN D-3 PO) Take 1 tablet by mouth at bedtime.   Yes [provider]  hydrochlorothiazide (HYDRODIURIL) 12.5 MG tablet Take 1 tablet (12.5 mg total) by mouth daily. 11/12/22 01/11/23 Yes Falan Hensler A, PA-C  Magnesium Oxide 420 MG TABS Take 840 mg by mouth at bedtime.   Yes [provider]  Omega-3 Fatty Acids (FISH OIL) 1000 MG CAPS Take 1,000 mg by mouth at bedtime.   Yes [provider]  POTASSIUM PO Take 2 tablets by mouth at bedtime.   Yes  [provider]  meloxicam (MOBIC) 7.5 MG tablet Take 1 tablet (7.5 mg total) by mouth daily. Patient not taking: Reported on 11/12/2022 06/22/18   Belinda Fisher, PA-C  methocarbamol (ROBAXIN) 500 MG tablet Take 1 tablet (500 mg total) by mouth 2 (two) times daily. Patient not taking: Reported on 11/12/2022 06/22/18   Belinda Fisher, PA-C      Allergies    Patient has no known allergies.    Review of Systems   Review of Systems  Constitutional: Negative.   HENT: Negative.    Respiratory: Negative.    Cardiovascular: Negative.   Gastrointestinal: Negative.   Genitourinary: Negative.   Musculoskeletal: Negative.   Skin: Negative.   Neurological:  Positive for headaches. Negative for dizziness, tremors, seizures, syncope, facial asymmetry, speech difficulty, weakness, light-headedness and numbness.  All other systems reviewed and are negative.   Physical Exam Updated Vital Signs BP (!) 157/115 (BP Location: Left Arm)   Pulse 88   Temp 98 F (36.7 C) (Oral)   Resp (!) 21   Ht 6' (1.829 m)   Wt 109.8 kg   SpO2 97%   BMI 32.82 kg/m  Physical Exam Physical Exam  Constitutional: Pt is oriented to person, place, and time. Pt appears well-developed and well-nourished. No distress.  HENT:  Head: Normocephalic and atraumatic.  Mouth/Throat: Oropharynx is clear and moist.  Eyes: Conjunctivae and EOM are normal. Pupils are equal, round, and reactive to light.  No scleral icterus.  No horizontal, vertical or rotational nystagmus  Neck: Normal range of motion. Neck supple.  Full active and passive ROM without pain No midline or paraspinal tenderness No nuchal rigidity or meningeal signs  Cardiovascular: Normal rate, regular rhythm and intact distal pulses.   Pulmonary/Chest: Effort normal and breath sounds normal. No respiratory distress. Pt has no wheezes. No rales.  Abdominal: Soft. Bowel sounds are normal. There is no tenderness. There is no rebound and no guarding.  Musculoskeletal:  Normal range of motion.  Lymphadenopathy:    No cervical adenopathy.  Neurological: Pt. is alert and oriented to person, place, and time. He has normal reflexes. No cranial nerve deficit.  Exhibits normal muscle tone. Coordination normal.  Mental Status:  Alert, oriented, thought content appropriate. Speech fluent without evidence of aphasia. Able to follow 2 step commands without difficulty.  Cranial Nerves:  2-12 grossly intact Motor:  Equal strength Sensory: Intact sensation Cerebellar: normal finger-to-nose with bilateral upper extremities Gait: normal gait and balance CV: distal pulses palpable throughout   Skin: Skin is warm and dry. No rash noted. Pt is not diaphoretic.  Psychiatric: Pt has a normal mood and affect. Behavior is normal. Judgment and thought content normal.  Nursing note and vitals reviewed.  ED Results / Procedures / Treatments   Labs (all labs ordered are listed, but only abnormal results are displayed) Labs Reviewed  BASIC METABOLIC PANEL - Abnormal; Notable for the following components:      Result Value   Glucose, Bld 107 (*)    Calcium 8.7 (*)    All other components within normal limits  CBC WITH DIFFERENTIAL/PLATELET    EKG EKG Interpretation Date/Time:  Wednesday November 12 2022 15:38:28 EDT Ventricular Rate:  74 PR Interval:  159 QRS Duration:  90 QT Interval:  370 QTC Calculation: 411 R Axis:   83  Text Interpretation: Sinus rhythm No significant change since last tracing Confirmed by Susy Frizzle [phone]) on 11/12/2022 3:41:33 PM  Radiology CT HEAD WO CONTRAST ( )  Result Date: 11/12/2022 CLINICAL DATA:  Headache. EXAM: CT HEAD WITHOUT CONTRAST TECHNIQUE: Contiguous axial images were obtained from the base of the skull through the vertex without intravenous contrast. RADIATION DOSE REDUCTION: This exam was performed according to the departmental dose-optimization program which includes automated exposure control, adjustment of the mA  and/or kV according to patient size and/or use of iterative reconstruction technique. COMPARISON:  None Available. FINDINGS: Brain: No evidence of acute infarction, hemorrhage, hydrocephalus, extra-axial collection or mass lesion/mass effect. Vascular: No hyperdense vessel or unexpected calcification. Skull: Normal. Negative for fracture or focal lesion. Sinuses/Orbits: No acute finding. Other: None. IMPRESSION: Normal head CT. Electronically Signed   By: Aram Candela M.D.   On: 11/12/2022 17:43    Procedures Procedures    Medications Ordered in ED Medications  hydrochlorothiazide (HYDRODIURIL) tablet 12.5 mg (has no administration in time range)    ED Course/ Medical Decision Making/ A&P   52 year old here for evaluation of headache over the last 3 days. No sudden onset thunderclap headache.  He has a nonfocal neuroexam without deficits. No fever, vision changes, neck pain, neck stiffness.  Low suspicion for meningitis.  No eye pain, vision changes low suspicion for acute angle glaucoma.  No recent trauma or injury to suggest acute traumatic bleed.  No chest pain, shortness of breath to suggest ACS, PE, dissection.  He is no clinical evidence of VTE on exam.  No nausea, vomiting, abdominal pain.  Significantly hypertensive here  184/125, he does not want medication at this time for his blood pressure or his headache.  Will plan on CT head, basic labs, EKG and reassess  Labs and imaging personally viewed and interpreted: CBC without leukocytosis  Metabolic panel glucose 107 EKG without ischemic changes CT head without acute findings  Patient reassessed.  Continues on nonfocal neuroexam without deficits.  Blood pressure trending down however still hypertensive.  Given has been occurring for quite some time with his elevated blood pressures he has tried diet changes without relief he would likely benefit from starting on blood pressure medication.  Will have him follow-up outpatient establish  care with PCP.  Patient reassessed.  Discussed his labs and imaging.  Headache has resolved spontaneously, he continues have a nonfocal neuroexam without deficit, tolerating p.o. intake, chest pain, shortness of breath.  At this time I have low suspicion for hypertensive urgency or emergency.  He has declined IV blood pressure medication here in the ED or medication for a HA, he is agreeable to starting a medication, will give him first dose here given he is late in the evening he cannot pick up his dose in the outpatient setting.  TOC RN contacted to help patient establish care given his language is Spanish.  Discussed return precautions.  Will have him follow-up outpatient, return for new or worsening symptoms.  The patient has been appropriately medically screened and/or stabilized in the ED. I have low suspicion for any other emergent medical condition which would require further screening, evaluation or treatment in the ED or require inpatient management.  Patient is hemodynamically stable and in no acute distress.  Patient able to ambulate in department prior to ED.  Evaluation does not show acute pathology that would require ongoing or additional emergent interventions while in the emergency department or further inpatient treatment.  I have discussed the diagnosis with the patient and answered all questions.  Pain is been managed while in the emergency department and patient has no further complaints prior to discharge.  Patient is comfortable with plan discussed in room and is stable for discharge at this time.  I have discussed strict return precautions for returning to the emergency department.  Patient was encouraged to follow-up with PCP/specialist refer to at discharge.                                   Medical Decision Making Amount and/or Complexity of Data Reviewed Independent Historian: spouse External Data Reviewed: labs, radiology, ECG and notes. Labs: ordered. Decision-making  details documented in ED Course. Radiology: ordered and independent interpretation performed. Decision-making details documented in ED Course. ECG/medicine tests: ordered and independent interpretation performed. Decision-making details documented in ED Course.  Risk OTC drugs. Prescription drug management. Decision regarding hospitalization. Diagnosis or treatment significantly limited by social determinants of health.           Final Clinical Impression(s) / ED Diagnoses Final diagnoses:  Uncontrolled hypertension  Acute nonintractable headache, unspecified headache type    Rx / DC Orders ED Discharge Orders          Ordered    hydrochlorothiazide (HYDRODIURIL) 12.5 MG tablet  Daily        11/12/22 1715              Treyveon Mochizuki A, PA-C 11/12/22 1812    Pollyann Savoy, MD 11/12/22 [phone]

## 2022-11-12 NOTE — Care Management (Signed)
Transition of Care [facility]) - Emergency Department Mini Assessment   Patient Details  Name: Zachary Church MRN: [id number] Date of Birth: Oct 11, 1970  Transition of Care [facility]) CM/SW Contact:    Lavenia Atlas, RN Phone Number: 11/12/2022, 5:57 PM   Clinical Narrative:  [name] consult for PCP needs. This RNCM spoke with patient who reports he does have insurance however does not have a PCP. This RNCM added [facility] Internal contact information for patient to call to establish PCP care. This RNCM also encouraged patient to contact his medical insurance via CS phone number on his card to locate a PCP. Due to being after business hours this RNCM unable to assist with scheduling appointment. Patient reports he will call to schedule.   No additional TOC needs.   ED Mini Assessment: What brought you to the Emergency Department? : Patietn has c/o severe headache for three days, worst today  Barriers to Discharge: No Barriers Identified  Barrier interventions: providing PCP counseling  Means of departure: Car  Interventions which prevented an admission or readmission: PCP counseling    Patient Contact and Communications     Spoke with: Patient Contact Date: 11/12/22,   Contact time: 1757      Patient states their goals for this hospitalization and ongoing recovery are:: to feel better CMS Medicare.gov Compare Post Acute Care list provided to:: Patient Choice offered to / list presented to : Patient  Admission diagnosis:  HBP There are no problems to display for this patient.  PCP:  Patient, No Pcp Per Pharmacy:   [id number] - [place], [place] - [address] [place] [address] Phone: [phone] Fax: [phone]
